# Patient Record
Sex: Male | Born: 1995 | Race: White | Hispanic: No | Marital: Single | Smoking: Never smoker
Health system: Southern US, Community
[De-identification: ages and names within clinical notes are randomized; demographics above are authoritative.]

## PROBLEM LIST (undated history)

## (undated) DIAGNOSIS — F909 Attention-deficit hyperactivity disorder, unspecified type: Secondary | ICD-10-CM

---
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
## 2021-12-27 ENCOUNTER — Emergency Department
Admission: EM | Admit: 2021-12-27 | Discharge: 2021-12-27 | Disposition: A | Discharge status: Home / Self Care | Payer: Self-pay | Attending: Emergency Medicine | Admitting: Emergency Medicine

## 2021-12-27 ENCOUNTER — Emergency Department: Payer: Self-pay

## 2021-12-27 ENCOUNTER — Other Ambulatory Visit: Payer: Self-pay

## 2021-12-27 DIAGNOSIS — L03114 Cellulitis of left upper limb: Secondary | ICD-10-CM | POA: Insufficient documentation

## 2021-12-27 DIAGNOSIS — Z23 Encounter for immunization: Secondary | ICD-10-CM | POA: Insufficient documentation

## 2021-12-27 LAB — CBC WITH DIFFERENTIAL/PLATELET
Abs Immature Granulocytes: 0.04 10*3/uL (ref 0.00–0.07)
Basophils Absolute: 0 10*3/uL (ref 0.0–0.1)
Basophils Relative: 0 %
Eosinophils Absolute: 0 10*3/uL (ref 0.0–0.5)
Eosinophils Relative: 0 %
HCT: 43.9 % (ref 39.0–52.0)
Hemoglobin: 14.8 g/dL (ref 13.0–17.0)
Immature Granulocytes: 0 %
Lymphocytes Relative: 24 %
Lymphs Abs: 2.9 10*3/uL (ref 0.7–4.0)
MCH: 30.8 pg (ref 26.0–34.0)
MCHC: 33.7 g/dL (ref 30.0–36.0)
MCV: 91.3 fL (ref 80.0–100.0)
Monocytes Absolute: 1.5 10*3/uL — ABNORMAL HIGH (ref 0.1–1.0)
Monocytes Relative: 12 %
Neutro Abs: 7.9 10*3/uL — ABNORMAL HIGH (ref 1.7–7.7)
Neutrophils Relative %: 64 %
Platelets: 300 10*3/uL (ref 150–400)
RBC: 4.81 MIL/uL (ref 4.22–5.81)
RDW: 12.6 % (ref 11.5–15.5)
WBC: 12.3 10*3/uL — ABNORMAL HIGH (ref 4.0–10.5)
nRBC: 0 % (ref 0.0–0.2)

## 2021-12-27 LAB — COMPREHENSIVE METABOLIC PANEL
ALT: 16 U/L (ref 0–44)
AST: 21 U/L (ref 15–41)
Albumin: 4 g/dL (ref 3.5–5.0)
Alkaline Phosphatase: 58 U/L (ref 38–126)
Anion gap: 11 (ref 5–15)
BUN: 13 mg/dL (ref 6–20)
CO2: 23 mmol/L (ref 22–32)
Calcium: 9.3 mg/dL (ref 8.9–10.3)
Chloride: 104 mmol/L (ref 98–111)
Creatinine, Ser: 0.82 mg/dL (ref 0.61–1.24)
GFR, Estimated: >60 mL/min (ref >60)
Glucose, Bld: 146 mg/dL — ABNORMAL HIGH (ref 70–99)
Potassium: 3.5 mmol/L (ref 3.5–5.1)
Sodium: 138 mmol/L (ref 135–145)
Total Bilirubin: 1 mg/dL (ref 0.3–1.2)
Total Protein: 7.4 g/dL (ref 6.5–8.1)

## 2021-12-27 LAB — LACTIC ACID, PLASMA: Lactic Acid, Venous: 0.8 mmol/L (ref 0.5–1.9)

## 2021-12-27 MED ORDER — CEPHALEXIN 500 MG PO CAPS: 500.0000 mg | ORAL_CAPSULE | Freq: Three times a day (TID) | ORAL | 0 refills | Status: DC

## 2021-12-27 MED ORDER — SULFAMETHOXAZOLE-TRIMETHOPRIM 800-160 MG PO TABS: 1.0000 | ORAL_TABLET | Freq: Two times a day (BID) | ORAL | 0 refills | Status: DC

## 2021-12-27 MED ORDER — SULFAMETHOXAZOLE-TRIMETHOPRIM 800-160 MG PO TABS
1.0000 | ORAL_TABLET | Freq: Once | ORAL | Status: AC
  Administered 2021-12-27: 1 via ORAL
  Filled 2021-12-27: qty 1

## 2021-12-27 MED ORDER — CEPHALEXIN 500 MG PO CAPS
500.0000 mg | ORAL_CAPSULE | Freq: Once | ORAL | Status: AC
  Administered 2021-12-27: 500 mg via ORAL
  Filled 2021-12-27: qty 1

## 2021-12-27 MED ORDER — TETANUS-DIPHTH-ACELL PERTUSSIS 5-2.5-18.5 LF-MCG/0.5 IM SUSY
0.5000 mL | PREFILLED_SYRINGE | Freq: Once | INTRAMUSCULAR | Status: AC
  Administered 2021-12-27: 0.5 mL via INTRAMUSCULAR
  Filled 2021-12-27: qty 0.5

## 2021-12-27 NOTE — ED Triage Notes (Signed)
Pt with left hand swelling. Pt states he had a splinter last week, but hand swelling has begun today. Pt with redness and swelling noted to left hand. Pt states has decreased movement in hand due to swelling. Pt denies known fever or chills.  ?

## 2021-12-27 NOTE — ED Notes (Signed)
ONE SET OF BLOOD CULTURES OBTAINED IN TRIAGE ?

## 2021-12-27 NOTE — ED Provider Notes (Signed)
? ?[facility] ?Provider Note ? ? ? Event Date/Time  ? First MD Initiated Contact with Patient 12/27/21 2028   ?  (approximate) ? ? ?History  ? ?hand swelling ? ? ?HPI ? ?Logan Morris is a 26 y.o. male with no significant past medical history who complains of pain swelling and redness of the left palm which started this morning.  Constant, tender to the touch.  No loss of sensation or strength.  Full range of motion in the fingers. ? ?He reports that while he was doing woodworking a week ago, he sustained a splinter to the left palm.  He feels that he was able to retrieve it and everything seemed fine until today when he noted a lot of swelling and redness.  Denies any a sending pain, no chest pain or shortness of breath.  No fever. ?  ? ? ?Physical Exam  ? ?Triage Vital Signs: ?ED Triage Vitals [12/27/21 1906]  ?Enc Vitals Group  ?   BP (!) 169/77  ?   Pulse Rate 84  ?   Resp 20  ?   Temp 100 ?F (37.8 ?C)  ?   Temp Source Oral  ?   SpO2 100 %  ?   Weight 150 lb (68 kg)  ?   Height 6' (1.829 m)  ?   Head Circumference   ?   Peak Flow   ?   Pain Score 5  ?   Pain Loc   ?   Pain Edu?   ?   Excl. in GC?   ? ? ?Most recent vital signs: ?Vitals:  ? 12/27/21 1906  ?BP: (!) 169/77  ?Pulse: 84  ?Resp: 20  ?Temp: 100 ?F (37.8 ?C)  ?SpO2: 100%  ? ? ? ?General: Awake, no distress.  ?CV:  Good peripheral perfusion.  Normal pulses ?Resp:  Normal effort.  ?Abd:  No distention.  ?Other:  Left hand shows a small puncture wound over the palm overlying the midshaft of the second metacarpal.  There is swelling in that area extending over the thenar eminence to the dorsum.  There is no crepitus.  Firm palpation I am able to express a small amount of purulent drainage through the wound.  No fluctuance.  No devitalized or dusky skin ?As part of the exam, I performed point-of-care bedside ultrasound to evaluate the soft tissues.  Imaging demonstrates cobblestoning of the superficial soft tissues consistent with  edema.  no foreign body identified. ? ? ?ED Results / Procedures / Treatments  ? ?Labs ?(all labs ordered are listed, but only abnormal results are displayed) ?Labs Reviewed  ?COMPREHENSIVE METABOLIC PANEL - Abnormal; Notable for the following components:  ?    Result Value  ? Glucose, Bld 146 (*)   ? All other components within normal limits  ?CBC WITH DIFFERENTIAL/PLATELET - Abnormal; Notable for the following components:  ? WBC 12.3 (*)   ? Neutro Abs 7.9 (*)   ? Monocytes Absolute 1.5 (*)   ? All other components within normal limits  ?LACTIC ACID, PLASMA  ? ? ? ?EKG ? ? ? ? ?RADIOLOGY ? ? ?PROCEDURES: ? ?Critical Care performed: No ? ?Procedures ? ? ?MEDICATIONS ORDERED IN ED: ?Medications  ?cephALEXin (KEFLEX) capsule 500 mg (has no administration in time range)  ?sulfamethoxazole-trimethoprim (BACTRIM DS) 800-160 MG per tablet 1 tablet (has no administration in time range)  ?Tdap (BOOSTRIX) injection 0.5 mL (has no administration in time range)  ? ? ? ?IMPRESSION /  MDM / ASSESSMENT AND PLAN / ED COURSE  ?I reviewed the triage vital signs and the nursing notes. ?             ?               ? ?Patient presents with cellulitis left hand.  He is right-hand dominant.  No evidence of osteomyelitis necrotizing fasciitis or abscess.  No sausage digit, no tenosynovitis.  No identifiable retained foreign body.  Tetanus updated.  Will start on Keflex and Bactrim for cellulitis.  Doubt fracture.  Return precautions discussed ? ? ? ? ?  ? ? ?FINAL CLINICAL IMPRESSION(S) / ED DIAGNOSES  ? ?Final diagnoses:  ?Cellulitis of left hand  ? ? ? ?Rx / DC Orders  ? ?ED Discharge Orders   ? ?      Ordered  ?  sulfamethoxazole-trimethoprim (BACTRIM DS) 800-160 MG tablet  2 times daily       ? 12/27/21 2130  ?  cephALEXin (KEFLEX) 500 MG capsule  3 times daily       ? 12/27/21 2130  ? ?  ?  ? ?  ? ? ? ?Note:  This document was prepared using Dragon voice recognition software and may include unintentional dictation errors. ?   ?Sharman Cheek, MD ?12/27/21 2135 ? ?

## 2021-12-27 NOTE — ED Notes (Signed)
Pt states he had a splinter stuck in left hand now hand is red and swollen ? ?

## 2021-12-27 NOTE — ED Notes (Signed)
Pt gave verbal consent to DC. Unable to sign due to hand  ? ?

## 2021-12-30 ENCOUNTER — Emergency Department
Admission: EM | Admit: 2021-12-30 | Discharge: 2021-12-30 | Disposition: A | Discharge status: Other Acute Inpatient Hospital | Payer: Self-pay | Attending: Emergency Medicine | Admitting: Emergency Medicine

## 2021-12-30 ENCOUNTER — Inpatient Hospital Stay (HOSPITAL_COMMUNITY)
Admission: EM | Admit: 2021-12-30 | Discharge: 2022-01-01 | DRG: 603 | Disposition: A | Discharge status: Home / Self Care | Payer: Self-pay | Attending: Internal Medicine | Admitting: Internal Medicine

## 2021-12-30 ENCOUNTER — Encounter: Payer: Self-pay | Admitting: Intensive Care

## 2021-12-30 ENCOUNTER — Encounter (HOSPITAL_COMMUNITY): Payer: Self-pay | Admitting: Emergency Medicine

## 2021-12-30 ENCOUNTER — Other Ambulatory Visit: Payer: Self-pay

## 2021-12-30 DIAGNOSIS — F17220 Nicotine dependence, chewing tobacco, uncomplicated: Secondary | ICD-10-CM | POA: Diagnosis present

## 2021-12-30 DIAGNOSIS — L03114 Cellulitis of left upper limb: Secondary | ICD-10-CM | POA: Diagnosis present

## 2021-12-30 DIAGNOSIS — I891 Lymphangitis: Secondary | ICD-10-CM

## 2021-12-30 DIAGNOSIS — F909 Attention-deficit hyperactivity disorder, unspecified type: Secondary | ICD-10-CM | POA: Diagnosis present

## 2021-12-30 DIAGNOSIS — Z20822 Contact with and (suspected) exposure to covid-19: Secondary | ICD-10-CM | POA: Diagnosis present

## 2021-12-30 DIAGNOSIS — L03129 Acute lymphangitis of unspecified part of limb: Secondary | ICD-10-CM | POA: Diagnosis present

## 2021-12-30 DIAGNOSIS — W458XXA Other foreign body or object entering through skin, initial encounter: Secondary | ICD-10-CM | POA: Diagnosis present

## 2021-12-30 DIAGNOSIS — L0391 Acute lymphangitis, unspecified: Secondary | ICD-10-CM | POA: Insufficient documentation

## 2021-12-30 DIAGNOSIS — L03124 Acute lymphangitis of left upper limb: Principal | ICD-10-CM | POA: Diagnosis present

## 2021-12-30 DIAGNOSIS — S61432A Puncture wound without foreign body of left hand, initial encounter: Secondary | ICD-10-CM | POA: Diagnosis present

## 2021-12-30 LAB — CBC WITH DIFFERENTIAL/PLATELET
Abs Immature Granulocytes: 0.03 10*3/uL (ref 0.00–0.07)
Basophils Absolute: 0 10*3/uL (ref 0.0–0.1)
Basophils Relative: 0 %
Eosinophils Absolute: 0 10*3/uL (ref 0.0–0.5)
Eosinophils Relative: 0 %
HCT: 43.1 % (ref 39.0–52.0)
Hemoglobin: 14.7 g/dL (ref 13.0–17.0)
Immature Granulocytes: 0 %
Lymphocytes Relative: 23 %
Lymphs Abs: 2.2 10*3/uL (ref 0.7–4.0)
MCH: 30.7 pg (ref 26.0–34.0)
MCHC: 34.1 g/dL (ref 30.0–36.0)
MCV: 90 fL (ref 80.0–100.0)
Monocytes Absolute: 1.2 10*3/uL — ABNORMAL HIGH (ref 0.1–1.0)
Monocytes Relative: 12 %
Neutro Abs: 6 10*3/uL (ref 1.7–7.7)
Neutrophils Relative %: 65 %
Platelets: 302 10*3/uL (ref 150–400)
RBC: 4.79 MIL/uL (ref 4.22–5.81)
RDW: 12.1 % (ref 11.5–15.5)
WBC: 9.5 10*3/uL (ref 4.0–10.5)
nRBC: 0 % (ref 0.0–0.2)

## 2021-12-30 LAB — COMPREHENSIVE METABOLIC PANEL
ALT: 17 U/L (ref 0–44)
AST: 21 U/L (ref 15–41)
Albumin: 3.9 g/dL (ref 3.5–5.0)
Alkaline Phosphatase: 47 U/L (ref 38–126)
Anion gap: 11 (ref 5–15)
BUN: 12 mg/dL (ref 6–20)
CO2: 25 mmol/L (ref 22–32)
Calcium: 9.4 mg/dL (ref 8.9–10.3)
Chloride: 101 mmol/L (ref 98–111)
Creatinine, Ser: 0.9 mg/dL (ref 0.61–1.24)
GFR, Estimated: >60 mL/min (ref >60)
Glucose, Bld: 133 mg/dL — ABNORMAL HIGH (ref 70–99)
Potassium: 3.8 mmol/L (ref 3.5–5.1)
Sodium: 137 mmol/L (ref 135–145)
Total Bilirubin: 0.6 mg/dL (ref 0.3–1.2)
Total Protein: 7.7 g/dL (ref 6.5–8.1)

## 2021-12-30 LAB — LACTIC ACID, PLASMA: Lactic Acid, Venous: 1.4 mmol/L (ref 0.5–1.9)

## 2021-12-30 LAB — RESP PANEL BY RT-PCR (FLU A&B, COVID) ARPGX2
Influenza A by PCR: NEGATIVE
Influenza B by PCR: NEGATIVE
SARS Coronavirus 2 by RT PCR: NEGATIVE

## 2021-12-30 MED ORDER — VANCOMYCIN HCL 1500 MG/300ML IV SOLN
1500.0000 mg | Freq: Once | INTRAVENOUS | Status: AC
  Administered 2021-12-30: 1500 mg via INTRAVENOUS
  Filled 2021-12-30: qty 300

## 2021-12-30 MED ORDER — FLUCONAZOLE 200 MG PO TABS
400.0000 mg | ORAL_TABLET | Freq: Once | ORAL | Status: DC
Start: 2021-12-30 — End: 2021-12-30
  Filled 2021-12-30: qty 2

## 2021-12-30 MED ORDER — FLUCONAZOLE IN SODIUM CHLORIDE 400-0.9 MG/200ML-% IV SOLN
400.0000 mg | INTRAVENOUS | Status: DC
  Administered 2021-12-31 – 2022-01-01 (×2): 400 mg via INTRAVENOUS
  Filled 2021-12-30 (×3): qty 200

## 2021-12-30 NOTE — ED Notes (Signed)
Pt to ED for L hand swelling that appears to be cellulitis. L hand swollen and red. Pt was seen on Sunday at same ED for same, and placed on PO abx. ?Pt now also has streaking along whole L arm up to axilla.  ? ?Pt had had splinter in L hand about 1 week prior. ? ?Pt had ultrasound on Sunday and was told splinter was out completely. Pt had dug out splinter with knife at home. Pt was given Tdap and sent home on PO abx and has taken as prescribed. ? ?

## 2021-12-30 NOTE — ED Notes (Signed)
Called [facility] back (Lauren) , she has paged ED attending whom is still tied up. Will page again in 15 mins ?

## 2021-12-30 NOTE — ED Notes (Signed)
Emtala reviewed by this RN ?

## 2021-12-30 NOTE — Assessment & Plan Note (Signed)
Continue IV rocephin, vanco, diflucan. Po vicodan for pain. ?

## 2021-12-30 NOTE — ED Provider Notes (Signed)
? ? ?[facility] ?Emergency Department Provider Note ? ? ? ? Event Date/Time  ? First MD Initiated Contact with Patient 12/30/21 1553   ?  (approximate) ? ? ?History  ? ?Cellulitis ? ? ?HPI ? ?Logan Morris is a 26 y.o. male with a history of ADHD, returns to the ED for evaluation of worsening left hand swelling as well as a red line streaking up the patient's arm.  Patient was seen here 3 days ago after he had a wood splinter in the palm.  Patient apparently removed the splinter prior to arrival, was further evaluated for some local skin infection.  Soft tissue bedside ultrasound did not reveal any gross foreign body, and the patient was treated empirically with oral antibiotics for cellulitis.  Patient returns to the ED today for worsening hand swelling.  Denies any purulent drainage, fever, chills, or sweats. ?  ? ? ?Physical Exam  ? ?Triage Vital Signs: ?ED Triage Vitals  ?Enc Vitals Group  ?   BP 12/30/21 1354 135/77  ?   Pulse Rate 12/30/21 1354 75  ?   Resp 12/30/21 1354 16  ?   Temp 12/30/21 1354 98.5 ?F (36.9 ?C)  ?   Temp Source 12/30/21 1354 Oral  ?   SpO2 12/30/21 1354 97 %  ?   Weight 12/30/21 1350 150 lb (68 kg)  ?   Height 12/30/21 1350 6' (1.829 m)  ?   Head Circumference --   ?   Peak Flow --   ?   Pain Score 12/30/21 1350 7  ?   Pain Loc --   ?   Pain Edu? --   ?   Excl. in GC? --   ? ? ?Most recent vital signs: ?Vitals:  ? 12/30/21 1800 12/30/21 1900  ?BP: (!) 134/91 136/68  ?Pulse: 65 65  ?Resp: 16 16  ?Temp:    ?SpO2: 100% 98%  ? ? ?General Awake, no distress.  ?CV:  Good peripheral perfusion. RRR ?RESP:  Normal effort. CTA ?ABD:  No distention.  ?SKIN:  Left upper extremity with significant edema to the right hand dorsally.  Patient with a scabbed left palmar wound without appreciable fluctuance or purulence.  Patient with significant lymphangitis up the volar aspect of the forearm to the axilla.  Normal composite fist on the left. ? ? ?ED Results / Procedures /  Treatments  ? ?Labs ?(all labs ordered are listed, but only abnormal results are displayed) ?Labs Reviewed  ?CBC WITH DIFFERENTIAL/PLATELET - Abnormal; Notable for the following components:  ?    Result Value  ? Monocytes Absolute 1.2 (*)   ? All other components within normal limits  ?COMPREHENSIVE METABOLIC PANEL - Abnormal; Notable for the following components:  ? Glucose, Bld 133 (*)   ? All other components within normal limits  ?RESP PANEL BY RT-PCR (FLU A&B, COVID) [id number]  ?CULTURE, BLOOD (ROUTINE X 2)  ?CULTURE, BLOOD (ROUTINE X 2)  ?LACTIC ACID, PLASMA  ? ? ? ?EKG ? ? ?RADIOLOGY ? ? ?No results found. ? ? ?PROCEDURES: ? ?Critical Care performed: No ? ?Procedures ? ? ?MEDICATIONS ORDERED IN ED: ?Medications  ?vancomycin (VANCOREADY) IVPB 1500 mg/300 mL (0 mg Intravenous Stopped 12/30/21 1912)  ? ? ? ?IMPRESSION / MDM / ASSESSMENT AND PLAN / ED COURSE  ?I reviewed the triage vital signs and the nursing notes. ?             ?               ? ?  Differential diagnosis includes, but is not limited to, cellulitis, tenosynovitis, palmar abscess, sepsis ? ?The patient is on the cardiac monitor to evaluate for evidence of arrhythmia and/or significant heart rate changes. ? ?----------------------------------------- ?4:49 PM on 12/30/2021 ?----------------------------------------- ?Sherryll Burger suggested telephone consult with Dr. Joice Lofts before agreeing to admit, as often hand infections are transferred. ? ?----------------------------------------- ?6:24 PM on 12/30/2021 ?----------------------------------------- ?S/W Dr. Joice Lofts regarding case, while patient receive IV vancomycin, he suggested transfer to hospital with hand specialist.  ? ?----------------------------------------- ?6:47 PM on 12/30/2021 ?----------------------------------------- ?S/W Dr. Frazier Butt (Ortho-Hand) at [facility] he accepts the patient ED-ED for further IV antibiotics.  ? ?Patient and family agreeable to transfer/care plan at this time.  ? ? ?Patient with  subsequent ED visit for ongoing left hand swelling and progressive lymphangitis, following failed outpatient PO antibiotic management. patient's diagnosis is consistent with cellulitis/lymphangitis. Patient will be transferred to [facility], ED for further evaluation and continue IV antibiotics by the Ortho hand specialist.   ? ? ?FINAL CLINICAL IMPRESSION(S) / ED DIAGNOSES  ? ?Final diagnoses:  ?Cellulitis of left upper extremity  ?Lymphangitis  ? ? ? ?Rx / DC Orders  ? ?ED Discharge Orders   ? ? None  ? ?  ? ? ? ?Note:  This document was prepared using Dragon voice recognition software and may include unintentional dictation errors. ? ?  ?Lissa Hoard, PA-C ?12/30/21 1929 ? ?  ?Phineas Semen, MD ?12/30/21 1945 ? ?

## 2021-12-30 NOTE — ED Triage Notes (Signed)
Patient presents with worsening left hand swelling and now has red line present from hand to axillary. Seen here 12/27/21 and placed on oral antibiotics.  ?

## 2021-12-30 NOTE — ED Triage Notes (Signed)
Pt BIB [facility] from [facility] with c/o worsening left hand swelling with red streaking up his arm.  ?

## 2021-12-30 NOTE — Assessment & Plan Note (Addendum)
Admit to med/surg bed. Observation. IV rocephin, vanco. Add IV diflucan. 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 Send fungal blood cultures. ?

## 2021-12-30 NOTE — ED Notes (Signed)
Pt resting comfortably in bed with visitor x1 at bedside, NAD noted, resps even regular and nonlabored. Pt with swelling noted to L hand/wrist, with redness extending up to shoulder. Pt denies needs at this time. ?

## 2021-12-30 NOTE — ED Notes (Signed)
1st lactic drawn at 1600. Second lactic due at 1800. ?

## 2021-12-30 NOTE — ED Notes (Signed)
Provider at bedside

## 2021-12-30 NOTE — ED Notes (Signed)
Called [facility] Leotis Shames) concerning transfer of pt to [facility] ED per Frazier Butt, MD accepting for hand. Frazier Butt, MD wants Tomasa Blase, [place] to transfer pt to ED. Tomasa Blase, PA needs to speak with ED attending whom atbthis time is getting a STEMI in the ED but will return call in approx. 30 min. ?

## 2021-12-30 NOTE — ED Notes (Signed)
Called [facility] to request consult with transfer to Hand specialty spoke to [facility] @ [phone] waiting on call back ? ?

## 2021-12-30 NOTE — ED Notes (Signed)
1 set cultures and lactic drawn with IV placement and sent to lab. ?

## 2021-12-30 NOTE — Subjective & Objective (Addendum)
CC: left hand swelling, red streaking up left arm ?HPI: ?26 year old male with no previous medical history presents to the ER today with red streaking and swelling of his left hand.  Patient states about 2 weeks ago he sustained 2 puncture sites to his left hand with splinters while he was working.  He states that he pulled out 2 splinters out of his hand 1 that night in the shower and next 1 in the next morning.  He states he had a Janeway bit of finger soreness of his left index finger for several days afterwards.  He went about a week without any pain or swelling in his hand.  Then suddenly developed pain and swelling in his left hand.  He went to the ER.  This was on December 27, 2021.  He was prescribed Bactrim and Keflex.  He states he took 3 days of this.  Today he noticed increasing swelling of his left hand, red streaking up his left arm.  He went back to [facility] ER.  He was transferred to [facility], ER after ED to ED transfer.  EDP had contacted hand surgery. ? ?Patient had tetanus shot in the ER this week. ? ?Hand surgery is reviewed the pictures and does not think the patient has compartment syndrome. ? ?[facility] hospitalist contacted for admission. ?

## 2021-12-30 NOTE — H&P (Signed)
?History and Physical  ? ? ?Logan Morris TKZ:[id number] DOB: 1996-04-08 DOA: 12/30/2021 ? ?DOS: the patient was seen and examined on 12/30/2021 ? ?PCP: Pcp, No  ? ?Patient coming from: Home ? ?I have personally briefly reviewed patient's old medical [facility] ? ?CC: left hand swelling, red streaking up left arm ?HPI: ?26 year old male with no previous medical history presents to the ER today with red streaking and swelling of his left hand.  Patient states about 2 weeks ago he sustained 2 puncture sites to his left hand with splinters while he was working.  He states that he pulled out 2 splinters out of his hand 1 that night in the shower and next 1 in the next morning.  He states he had a Coke bit of finger soreness of his left index finger for several days afterwards.  He went about a week without any pain or swelling in his hand.  Then suddenly developed pain and swelling in his left hand.  He went to the ER.  This was on December 27, 2021.  He was prescribed Bactrim and Keflex.  He states he took 3 days of this.  Today he noticed increasing swelling of his left hand, red streaking up his left arm.  [facility].  [facility] to ED transfer.  EDP had contacted hand surgery. ? ?Patient had tetanus shot in the ER this week. ? ?Hand surgery is reviewed the pictures and does not think the patient has compartment syndrome. ? ?[facility] hospitalist contacted for admission.  ? ?ED Course: [facility] after phone consultation with ortho hand. ? ?Review of Systems:  ?Review of Systems  ?Constitutional: Negative.  Negative for chills and fever.  ?HENT: Negative.    ?Eyes: Negative.   ?Respiratory: Negative.    ?Cardiovascular: Negative.   ?Gastrointestinal: Negative.   ?Genitourinary: Negative.   ?Musculoskeletal:   ?     Pain in left hand ?Red streaking up his left arm ?Pt is right hand dominant. ?+pain along red streaking  ?Skin:   ?      Increased erythema left dorsum of hand. Red streaking up left arm.  ?Neurological: Negative.   ?Endo/Heme/Allergies: Negative.   ?Psychiatric/Behavioral: Negative.    ?All other systems reviewed and are negative. ? ?Past Medical History:  ?Diagnosis Date  ? Attention deficit hyperactivity disorder (ADHD)   ? ? ?History reviewed. No pertinent surgical history. ? ? reports that he has never smoked. He uses smokeless tobacco. He reports current alcohol use of about 12.0 standard drinks per week. He reports current drug use. Drug: Marijuana. ? ?No Known Allergies ? ?No family history on file. ? ?Prior to Admission medications   ?Medication Sig Start Date End Date Taking? Authorizing Provider  ?cephALEXin (KEFLEX) 500 MG capsule Take 1 capsule (500 mg total) by mouth 3 (three) times daily. 12/27/21  Yes Sharman CheekStafford, Phillip, MD  ?sulfamethoxazole-trimethoprim (BACTRIM DS) 800-160 MG tablet Take 1 tablet by mouth 2 (two) times daily. 12/27/21  Yes Sharman CheekStafford, Phillip, MD  ? ? ?Physical Exam: ?Vitals:  ? 12/30/21 2141  ?BP: 140/80  ?Pulse: 68  ?Resp: 18  ?Temp: 98.6 ?F (37 ?C)  ?SpO2: 98%  ? ? ?Physical Exam ?Vitals and nursing note reviewed.  ?Constitutional:   ?   General: He is not in acute distress. ?   Appearance: Normal appearance. He is normal weight. He is not ill-appearing, toxic-appearing  or diaphoretic.  ?HENT:  ?   Head: Normocephalic and atraumatic.  ?   Nose: Nose normal. No rhinorrhea.  ?Eyes:  ?   General: No scleral icterus. ?Cardiovascular:  ?   Rate and Rhythm: Normal rate and regular rhythm.  ?   Pulses: Normal pulses.  ?Pulmonary:  ?   Effort: Pulmonary effort is normal. No respiratory distress.  ?   Breath sounds: Normal breath sounds. No wheezing or rales.  ?Abdominal:  ?   General: Abdomen is flat. Bowel sounds are normal. There is no distension.  ?   Tenderness: There is no abdominal tenderness. There is no guarding or rebound.  ?Skin: ?   Capillary Refill: Capillary refill takes less than 2 seconds.  ?    Findings: Erythema present.  ?   Comments: +edema dorsum left hand ?Multiple calluses hand ?Small puncture mark on palm of left hand ?+red streaking up left forearm and left upper arm. Outlined in black marker ? ?See pictures of erythema and red streaking.  ?Neurological:  ?   General: No focal deficit present.  ?   Mental Status: He is alert and oriented to person, place, and time.  ?  ? ? ? ? ? ? ? ? ?Labs on Admission: I have personally reviewed following labs and imaging studies ? ?CBC: ?Recent Labs  ?Lab 12/27/21 ?1916 12/30/21 ?1359  ?WBC 12.3* 9.5  ?NEUTROABS 7.9* 6.0  ?HGB 14.8 14.7  ?HCT 43.9 43.1  ?MCV 91.3 90.0  ?PLT 300 302  ? ?Basic Metabolic Panel: ?Recent Labs  ?Lab 12/27/21 ?1916 12/30/21 ?1359  ?NA 138 137  ?K 3.5 3.8  ?CL 104 101  ?CO2 23 25  ?GLUCOSE 146* 133*  ?BUN 13 12  ?CREATININE 0.82 0.90  ?CALCIUM 9.3 9.4  ? ?GFR: ?Estimated Creatinine Clearance: 120.7 mL/min (by C-G formula based on SCr of 0.9 mg/dL). ?Liver Function Tests: ?Recent Labs  ?Lab 12/27/21 ?1916 12/30/21 ?1359  ?AST 21 21  ?ALT 16 17  ?ALKPHOS 58 47  ?BILITOT 1.0 0.6  ?PROT 7.4 7.7  ?ALBUMIN 4.0 3.9  ? ?No results for input(s): LIPASE, AMYLASE in the last 168 hours. ?No results for input(s): AMMONIA in the last 168 hours. ?Coagulation Profile: ?No results for input(s): INR, PROTIME in the last 168 hours. ?Cardiac Enzymes: ?No results for input(s): CKTOTAL, CKMB, CKMBINDEX, TROPONINI, TROPONINIHS in the last 168 hours. ?BNP (last 3 results) ?No results for input(s): PROBNP in the last 8760 hours. ?HbA1C: ?No results for input(s): HGBA1C in the last 72 hours. ?CBG: ?No results for input(s): GLUCAP in the last 168 hours. ?Lipid Profile: ?No results for input(s): CHOL, HDL, LDLCALC, TRIG, CHOLHDL, LDLDIRECT in the last 72 hours. ?Thyroid Function Tests: ?No results for input(s): TSH, T4TOTAL, FREET4, T3FREE, THYROIDAB in the last 72 hours. ?Anemia Panel: ?No results for input(s): VITAMINB12, FOLATE, FERRITIN, TIBC, IRON, RETICCTPCT  in the last 72 hours. ?Urine analysis: ?   ?Component Value Date/Time  ? COLORURINE Yellow 06/04/2014 0402  ? APPEARANCEUR Clear 06/04/2014 0402  ? LABSPEC 1.020 06/04/2014 0402  ? PHURINE 6.0 06/04/2014 0402  ? GLUCOSEU Negative 06/04/2014 0402  ? HGBUR Negative 06/04/2014 0402  ? BILIRUBINUR Negative 06/04/2014 0402  ? KETONESUR Trace 06/04/2014 0402  ? PROTEINUR Negative 06/04/2014 0402  ? NITRITE Negative 06/04/2014 0402  ? LEUKOCYTESUR Negative 06/04/2014 0402  ? ? ?Radiological Exams on Admission: I have personally reviewed images ?No results found. ? ?EKG: I have personally reviewed EKG: no EKG ? ?Assessment/Plan ?Principal Problem: ?  Acute lymphangitis of upper arm ?Active Problems: ?  Cellulitis of left hand ?  ? ?Assessment and Plan: ?* Acute lymphangitis of upper arm ?Admit to med/surg bed. Observation. IV rocephin, vanco. Add IV diflucan. 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 Send fungal blood cultures. ? ?Cellulitis of left hand ?Continue IV rocephin, vanco, diflucan. Po vicodan for pain. ? ? ?DVT prophylaxis: SCDs ?Code Status: Full Code ?Family Communication: discussed with pt and his aunt   ?Disposition Plan: return home  ?Consults called: none  ?Admission status: Observation, Med-Surg ? ? ?Carollee Herter, DO ?[facility] ?12/30/2021, 11:47 PM  ? ? ?

## 2021-12-31 ENCOUNTER — Encounter (HOSPITAL_COMMUNITY): Payer: Self-pay | Admitting: Internal Medicine

## 2021-12-31 LAB — CBC WITH DIFFERENTIAL/PLATELET
Abs Immature Granulocytes: 0.02 10*3/uL (ref 0.00–0.07)
Basophils Absolute: 0.1 10*3/uL (ref 0.0–0.1)
Basophils Relative: 1 %
Eosinophils Absolute: 0 10*3/uL (ref 0.0–0.5)
Eosinophils Relative: 0 %
HCT: 41.4 % (ref 39.0–52.0)
Hemoglobin: 13.9 g/dL (ref 13.0–17.0)
Immature Granulocytes: 0 %
Lymphocytes Relative: 25 %
Lymphs Abs: 2.1 10*3/uL (ref 0.7–4.0)
MCH: 31.1 pg (ref 26.0–34.0)
MCHC: 33.6 g/dL (ref 30.0–36.0)
MCV: 92.6 fL (ref 80.0–100.0)
Monocytes Absolute: 1 10*3/uL (ref 0.1–1.0)
Monocytes Relative: 12 %
Neutro Abs: 5.2 10*3/uL (ref 1.7–7.7)
Neutrophils Relative %: 62 %
Platelets: 278 10*3/uL (ref 150–400)
RBC: 4.47 MIL/uL (ref 4.22–5.81)
RDW: 12.3 % (ref 11.5–15.5)
WBC: 8.4 10*3/uL (ref 4.0–10.5)
nRBC: 0 % (ref 0.0–0.2)

## 2021-12-31 LAB — COMPREHENSIVE METABOLIC PANEL
ALT: 19 U/L (ref 0–44)
AST: 23 U/L (ref 15–41)
Albumin: 3.7 g/dL (ref 3.5–5.0)
Alkaline Phosphatase: 49 U/L (ref 38–126)
Anion gap: 9 (ref 5–15)
BUN: 12 mg/dL (ref 6–20)
CO2: 27 mmol/L (ref 22–32)
Calcium: 9.3 mg/dL (ref 8.9–10.3)
Chloride: 101 mmol/L (ref 98–111)
Creatinine, Ser: 0.98 mg/dL (ref 0.61–1.24)
GFR, Estimated: >60 mL/min (ref >60)
Glucose, Bld: 128 mg/dL — ABNORMAL HIGH (ref 70–99)
Potassium: 4.1 mmol/L (ref 3.5–5.1)
Sodium: 137 mmol/L (ref 135–145)
Total Bilirubin: 0.5 mg/dL (ref 0.3–1.2)
Total Protein: 6.6 g/dL (ref 6.5–8.1)

## 2021-12-31 LAB — HIV ANTIBODY (ROUTINE TESTING W REFLEX): HIV Screen 4th Generation wRfx: NONREACTIVE

## 2021-12-31 MED ORDER — ACETAMINOPHEN 650 MG RE SUPP: 650.0000 mg | Freq: Four times a day (QID) | RECTAL | Status: DC | PRN

## 2021-12-31 MED ORDER — ONDANSETRON HCL 4 MG PO TABS: 4.0000 mg | ORAL_TABLET | Freq: Four times a day (QID) | ORAL | Status: DC | PRN

## 2021-12-31 MED ORDER — ENOXAPARIN SODIUM 40 MG/0.4ML IJ SOSY
40.0000 mg | PREFILLED_SYRINGE | INTRAMUSCULAR | Status: DC
  Administered 2021-12-31: 40 mg via SUBCUTANEOUS
  Filled 2021-12-31: qty 0.4

## 2021-12-31 MED ORDER — ACETAMINOPHEN 325 MG PO TABS: 650.0000 mg | ORAL_TABLET | Freq: Four times a day (QID) | ORAL | Status: DC | PRN

## 2021-12-31 MED ORDER — SODIUM CHLORIDE 0.9 % IV SOLN
1.0000 g | INTRAVENOUS | Status: DC
  Administered 2021-12-31: 1 g via INTRAVENOUS
  Filled 2021-12-31 (×2): qty 10

## 2021-12-31 MED ORDER — ONDANSETRON HCL 4 MG/2ML IJ SOLN: 4.0000 mg | Freq: Four times a day (QID) | INTRAMUSCULAR | Status: DC | PRN

## 2021-12-31 MED ORDER — SODIUM CHLORIDE 0.9 % IV SOLN
1.0000 g | Freq: Once | INTRAVENOUS | Status: AC
  Administered 2021-12-31: 1 g via INTRAVENOUS
  Filled 2021-12-31: qty 10

## 2021-12-31 MED ORDER — VANCOMYCIN HCL 1250 MG/250ML IV SOLN
1250.0000 mg | Freq: Two times a day (BID) | INTRAVENOUS | Status: DC
  Administered 2021-12-31 – 2022-01-01 (×3): 1250 mg via INTRAVENOUS
  Filled 2021-12-31 (×5): qty 250

## 2021-12-31 MED ORDER — HYDROCODONE-ACETAMINOPHEN 5-325 MG PO TABS: 1.0000 | ORAL_TABLET | ORAL | Status: DC | PRN

## 2021-12-31 NOTE — ED Provider Notes (Signed)
?[facility] ?Provider Note ? ? ?CSN: [id number] ?Arrival date & time: 12/30/21  2137 ? ?  ? ?History ? ?Chief Complaint  ?Patient presents with  ? Cellulitis  ? ? ?Logan Morris is a 26 y.o. male. ? ?HPI ? ?Patient without significant medical history presents with complaints of left hand infection, patient is right-hand dominant.  Patient states about 2 weeks ago he was being the bed frame and a splinter went to the palm of his left hand.  He states he has removed without difficulty.  But on Sunday he noted worsening swelling and pain in his hand so he went to the emergency room where they started him on Bactrim as well as Keflex.  He went back to the emergency department today where they noted that his hand was getting more swollen and he is having streaking up his left arm, he was then transferred to [facility] for further evaluation.  He states that he currently has some pain in his left hand and remains mainly within his palm, he is able to move all fingers but he states he has difficulty moving his index finger due to the swelling.  He has had no fevers or chills no paresthesias or weakness he not immunocompromise, up-to-date on his tetanus shot, denies any illicit drug use. ? ? ?I have reviewed patient's chart he was [facility] Sunday, where he obtains x-ray of the hand which was unremarkable, ultrasound was also performed which was unremarkable as well, he was discharged home with Bactrim as well as Keflex.  He was then seen [facility] at [facility] where they noted that he had an elevated white count and worsening swelling concerning for cellulitis versus tenosynovitis, Dr. Frazier ButtBenfield of hand surgery was consulted on the patient he recommended ED ED transfer, blood cultures have been obtained, and he can receive a single dose of vancomycin. ?Home Medications ?Prior to Admission medications   ?Medication Sig Start Date End Date Taking? Authorizing Provider  ?cephALEXin (KEFLEX)  500 MG capsule Take 1 capsule (500 mg total) by mouth 3 (three) times daily. 12/27/21  Yes Sharman CheekStafford, Phillip, MD  ?sulfamethoxazole-trimethoprim (BACTRIM DS) 800-160 MG tablet Take 1 tablet by mouth 2 (two) times daily. 12/27/21  Yes Sharman CheekStafford, Phillip, MD  ?   ? ?Allergies    ?Patient has no known allergies.   ? ?Review of Systems   ?Review of Systems  ?Constitutional:  Negative for chills and fever.  ?Respiratory:  Negative for shortness of breath.   ?Cardiovascular:  Negative for chest pain.  ?Gastrointestinal:  Negative for abdominal pain.  ?Skin:  Positive for rash and wound.  ?Neurological:  Negative for headaches.  ? ?Physical Exam ?Updated Vital Signs ?BP 140/80   Pulse 68   Temp 98.6 ?F (37 ?C)   Resp 18   SpO2 98%  ?Physical Exam ?Vitals and nursing note reviewed.  ?Constitutional:   ?   General: He is not in acute distress. ?   Appearance: He is not ill-appearing.  ?HENT:  ?   Head: Normocephalic and atraumatic.  ?   Nose: No congestion.  ?Eyes:  ?   Conjunctiva/sclera: Conjunctivae normal.  ?Cardiovascular:  ?   Rate and Rhythm: Normal rate and regular rhythm.  ?   Pulses: Normal pulses.  ?   Heart sounds: No murmur heard. ?  No friction rub. No gallop.  ?Pulmonary:  ?   Effort: No respiratory distress.  ?   Breath sounds: No wheezing,  rhonchi or rales.  ?Musculoskeletal:  ?   Comments: Patient is left-handed to visualize it was erythematous and swollen mainly on the dorsum and palm of the hand, he had noted streaking on the anterior  of his forearm going up into his upper arm, hand was warm to the touch, he has a small puncture wound on the palm beneath the second MCP joint.  He has decreased range of motion with flexion of the second digit, but full motion with flexion-extension in all other joints of the hand, it was nontender during my examination.  Neurovascularly intact.  Please see picture for full detail.  ?Skin: ?   General: Skin is warm and dry.  ?Neurological:  ?   Mental Status: He is alert.   ?Psychiatric:     ?   Mood and Affect: Mood normal.  ? ? ? ? ? ? ? ? ? ?ED Results / Procedures / Treatments   ?Labs ?(all labs ordered are listed, but only abnormal results are displayed) ?Labs Reviewed  ?FUNGUS CULTURE, BLOOD  ?RAPID URINE DRUG SCREEN, HOSP PERFORMED  ? ? ?EKG ?None ? ?Radiology ?No results found. ? ?Procedures ?Procedures  ? ? ?Medications Ordered in ED ?Medications  ?fluconazole (DIFLUCAN) IVPB 400 mg (has no administration in time range)  ? ? ?ED Course/ Medical Decision Making/ A&P ?  ?                        ?Medical Decision Making ?Amount and/or Complexity of Data Reviewed ?Labs: ordered. ? ?Risk ?Decision regarding hospitalization. ? ? ?This patient presents to the ED for concern of right hand swelling, this involves an extensive number of treatment options, and is a complaint that carries with it a high risk of complications and morbidity.  The differential diagnosis includes sepsis, cellulitis, tenosynovitis, ? ? ? ?Additional history obtained: ? ?Additional history obtained from the chart medical record ?External records from outside source obtained and reviewed including please see HPI for further detail ? ? ?Co morbidities that complicate the patient evaluation ? ?N/A ? ?Social Determinants of Health: ? ?N/A ? ? ? ?Lab Tests: ? ?I Ordered, and personally interpreted labs.  The pertinent results include: CBC unremarkable, CMP shows glucose of 133, lactic unremarkable, respiratory panel negative, blood cultures pending ? ? ?Imaging Studies ordered: ? ?I ordered imaging studies including N/A ?I independently visualized and interpreted imaging which showed N/A ?I agree with the radiologist interpretation ? ? ?Cardiac Monitoring: ? ?The patient was maintained on a cardiac monitor.  I personally viewed and interpreted the cardiac monitored which showed an underlying rhythm of: N/A ? ? ?Medicines ordered and prescription drug management: ? ?I ordered medication including Diflucan for fungal  infection ?I have reviewed the patients home medicines and have made adjustments as needed ? ?Critical Interventions: ? ?N/A ? ? ?Reevaluation: ? ?Presents with hand infection I suspect this is likely cellulitis l with possible lymphangitis will consult with hand surgery for further recommendations. ? ?Consultations Obtained: ? ?I requested consultation with the hand surgery Dr. Frazier Butt,  and discussed lab and imaging findings as well as pertinent plan - they recommend: He was ischemia treated with IV antibiotics, does not feel this is tenosynovitis without significant tenderness along the palm.  He will be available consultation if needed. ?Spoke with Dr. Imogene Burn of the hospitalist team he will admit but asked we add on fungal cultures and started on antifungal medication. ? ?Rule out ?Low suspicion for sepsis patient  not meet sepsis or SIRS criteria.  Low suspicion for tenosynovitis as he has no significant tenderness along his flexor tendon, he does have limited range of motion in his second digit but I suspect this is secondary due to edema.  Low Suspicion for abscess as there is no fluctuance or induration present.  ? ? ? ?Dispostion and problem list ? ?After consideration of the diagnostic results and the patients response to treatment, I feel that the patent would benefit from admission. ? ?Cellulitis-likely patient has cellulitis from a puncture wound, started on broad-spectrum antibiotics as well as MRIs for fungal infection patient need further monitoring. ? ? ? ? ? ? ? ? ? ? ? ?Final Clinical Impression(s) / ED Diagnoses ?Final diagnoses:  ?Cellulitis of left upper extremity  ? ? ?Rx / DC Orders ?ED Discharge Orders   ? ? None  ? ?  ? ? ?  ?Carroll Sage, PA-C ?12/31/21 7353 ? ?  ?Dione Booze, MD ?12/31/21 0730 ? ?

## 2021-12-31 NOTE — Progress Notes (Signed)
Pharmacy Antibiotic Note ? ?Logan Morris is a 26 y.o. male admitted on 12/30/2021 with cellulitis.  Pharmacy has been consulted for vancomycin dosing. ? ?CBC 9.5 on 3/15 down from 12.3 on 3/12. ?Temp (24hrs), Avg:98.6 ?F (37 ?C), Min:98.5 ?F (36.9 ?C), Max:98.6 ?F (37 ?C) ? ?Plan: ?Vancomycin IV 1500 mg IV x 1 loading dose. ?Vancomycin 1250 mg IV Q 12 hrs. Goal AUC 400-550. ?Expected AUC: 490 ?SCr used: 0.9 ? ? ?Recent Labs  ?Lab 12/27/21 ?1916 12/30/21 ?1359 12/30/21 ?1552  ?WBC 12.3* 9.5  --   ?CREATININE 0.82 0.90  --   ?LATICACIDVEN 0.8  --  1.4  ?  ?Estimated Creatinine Clearance: 120.7 mL/min (by C-G formula based on SCr of 0.9 mg/dL).   ? ?No Known Allergies ? ?Antimicrobials this admission: ?Vancomycin 3/16 >> ?Ceftriaxone 3/16 >> ? ?Thank you for allowing pharmacy to be a part of this patient?s care. ? ?Alver Fisher, PharmD Candidate [phone] ?12/31/2021 1:00 AM ? ?

## 2021-12-31 NOTE — TOC Progression Note (Addendum)
Transition of Care (TOC) - Progression Note  ? ? ?Patient Details  ?Name: Shigeo Baugh Rakestraw ?MRN: [id number] ?Date of Birth: September 26, 1996 ? ?Transition of Care (TOC) CM/SW Contact  ?Kingsley Plan, RN ?Phone Number: ?12/31/2021, 11:41 AM ? ?Clinical Narrative:    ?Spoke to patient at bedside. Patient does not have insurance or PCP. Patient lives in [place] , discussed [facility].  ?Changed pharmacy to [facility] pharmacy , provided [facility] information information on AVS, also emailed Murtis Sink  at [facility]  ? ?Murtis Sink returned email and will call patient for appointment / NCM confirmed patient's cell number with patinet  ? ? ?Transition of Care (TOC) Screening Note ? ? ?Patient Details  ?Name: Corwin Kuiken Weisbecker ?Date of Birth: 05-17-1996 ? ? ?Transition of Care Department [facility]) has reviewed patient and no TOC needs have been identified at this time. We will continue to monitor patient advancement through interdisciplinary progression rounds. If new patient transition needs arise, please place a TOC consult. ?  ? ?  ?  ? ?Expected Discharge Plan and Services ?  ?  ?  ?  ?  ?                ?  ?  ?  ?  ?  ?  ?  ?  ?  ?  ? ? ?Social Determinants of Health (SDOH) Interventions ?  ? ?Readmission Risk Interventions ?No flowsheet data found. ? ?

## 2021-12-31 NOTE — Progress Notes (Signed)
?PROGRESS NOTE ? ? ? ?Logan Morris  XBD:[id number] DOB: Dec 02, 1995 DOA: 12/30/2021 ?PCP: Pcp, No  ? ? ?Brief Narrative:  ?26 year old male with no previous medical history presents to the ER today with red streaking and swelling of his left hand.  Patient states about 2 weeks ago he sustained 2 puncture sites to his left hand with splinters while he was working.  He states that he pulled out 2 splinters out of his hand 1 that night in the shower and next 1 in the next morning.  He states he had a Sherman bit of finger soreness of his left index finger for several days afterwards.  He went about a week without any pain or swelling in his hand.  Then suddenly developed pain and swelling in his left hand.  He went to the ER.  This was on December 27, 2021.  He was prescribed Bactrim and Keflex.  He states he took 3 days of this.  Today he noticed increasing swelling of his left hand, red streaking up his left arm.  He went back to [facility] ER.  He was transferred to [facility] ER after ED to ED transfer.  EDP had contacted hand surgery. ?  ?Patient had tetanus shot in the ER this week. ?  ?Hand surgery is reviewed the pictures and does not think the patient has compartment syndrome. ?  ?[facility] hospitalist contacted for admission.  ? ? ? ?Consultants:  ? ? ?Procedures:  ? ?Antimicrobials:  ?Ceftriaxone and vancomycin ? ? ?Subjective: ?Rt hand still very swollen, unable to move fully , but erythema improving. ? ?Objective: ?Vitals:  ? 12/31/21 0715 12/31/21 0730 12/31/21 0903 12/31/21 0954  ?BP: (!) 113/100 113/61 132/66   ?Pulse: 70 (!) 55 62   ?Resp:  18 19   ?Temp:   98.2 ?F (36.8 ?C)   ?TempSrc:   Oral   ?SpO2: 97% 97% 100%   ?Weight:    67.1 kg  ?Height:    6' (1.829 m)  ? ?No intake or output data in the 24 hours ending 12/31/21 1449 ?Filed Weights  ? 12/31/21 0954  ?Weight: 67.1 kg  ? ? ?Examination: ?Calm, NAD ?Cta no w/r ?Reg s1/s2 no gallop ?Soft benign +bs ?No edema. Rt hand swelling, mild erythema more on hand area  now.  ?Aaoxox3  ?Mood and affect appropriate in current setting  ? ? ? ?Data Reviewed: I have personally reviewed following labs and imaging studies ? ?CBC: ?Recent Labs  ?Lab 12/27/21 ?1916 12/30/21 ?1359 12/31/21 ?0206  ?WBC 12.3* 9.5 8.4  ?NEUTROABS 7.9* 6.0 5.2  ?HGB 14.8 14.7 13.9  ?HCT 43.9 43.1 41.4  ?MCV 91.3 90.0 92.6  ?PLT 300 302 278  ? ?Basic Metabolic Panel: ?Recent Labs  ?Lab 12/27/21 ?1916 12/30/21 ?1359 12/31/21 ?0206  ?NA 138 137 137  ?K 3.5 3.8 4.1  ?CL 104 101 101  ?CO2 23 25 27   ?GLUCOSE 146* 133* 128*  ?BUN 13 12 12   ?CREATININE 0.82 0.90 0.98  ?CALCIUM 9.3 9.4 9.3  ? ?GFR: ?Estimated Creatinine Clearance: 109.4 mL/min (by C-G formula based on SCr of 0.98 mg/dL). ?Liver Function Tests: ?Recent Labs  ?Lab 12/27/21 ?1916 12/30/21 ?1359 12/31/21 ?0206  ?AST 21 21 23   ?ALT 16 17 19   ?ALKPHOS 58 47 49  ?BILITOT 1.0 0.6 0.5  ?PROT 7.4 7.7 6.6  ?ALBUMIN 4.0 3.9 3.7  ? ?No results for input(s): LIPASE, AMYLASE in the last 168 hours. ?No results for input(s): AMMONIA in the last  168 hours. ?Coagulation Profile: ?No results for input(s): INR, PROTIME in the last 168 hours. ?Cardiac Enzymes: ?No results for input(s): CKTOTAL, CKMB, CKMBINDEX, TROPONINI in the last 168 hours. ?BNP (last 3 results) ?No results for input(s): PROBNP in the last 8760 hours. ?HbA1C: ?No results for input(s): HGBA1C in the last 72 hours. ?CBG: ?No results for input(s): GLUCAP in the last 168 hours. ?Lipid Profile: ?No results for input(s): CHOL, HDL, LDLCALC, TRIG, CHOLHDL, LDLDIRECT in the last 72 hours. ?Thyroid Function Tests: ?No results for input(s): TSH, T4TOTAL, FREET4, T3FREE, THYROIDAB in the last 72 hours. ?Anemia Panel: ?No results for input(s): VITAMINB12, FOLATE, FERRITIN, TIBC, IRON, RETICCTPCT in the last 72 hours. ?Sepsis Labs: ?Recent Labs  ?Lab 12/27/21 ?1916 12/30/21 ?1552  ?LATICACIDVEN 0.8 1.4  ? ? ?Recent Results (from the past 240 hour(s))  ?Resp Panel by RT-PCR (Flu A&B, Covid) Nasopharyngeal Swab      Status: None  ? Collection Time: 12/30/21  3:52 PM  ? Specimen: Nasopharyngeal Swab; Nasopharyngeal(NP) swabs in vial transport medium  ?Result Value Ref Range Status  ? SARS Coronavirus 2 by RT PCR NEGATIVE NEGATIVE Final  ?  Comment: (NOTE) ?SARS-CoV-2 target nucleic acids are NOT DETECTED. ? ?The SARS-CoV-2 RNA is generally detectable in upper respiratory ?specimens during the acute phase of infection. The lowest ?concentration of SARS-CoV-2 viral copies this assay can detect is ?138 copies/mL. A negative result does not preclude SARS-Cov-2 ?infection and should not be used as the sole basis for treatment or ?other patient management decisions. A negative result may occur with  ?improper specimen collection/handling, submission of specimen other ?than nasopharyngeal swab, presence of viral mutation(s) within the ?areas targeted by this assay, and inadequate number of viral ?copies(<138 copies/mL). A negative result must be combined with ?clinical observations, patient history, and epidemiological ?information. The expected result is Negative. ? ?Fact Sheet for Patients:  ?BloggerCourse.comhttps://www.fda.gov/media/152166/download ? ?Fact Sheet for Healthcare Providers:  ?SeriousBroker.ithttps://www.fda.gov/media/152162/download ? ?This test is no t yet approved or cleared by the [place] FDA and  ?has been authorized for detection and/or diagnosis of SARS-CoV-2 by ?FDA under an Emergency Use Authorization (EUA). This EUA will remain  ?in effect (meaning this test can be used) for the duration of the ?COVID-19 declaration under Section 564(b)(1) of the Act, 21 ?U.S.C.section 360bbb-3(b)(1), unless the authorization is terminated  ?or revoked sooner.  ? ? ?  ? Influenza A by PCR NEGATIVE NEGATIVE Final  ? Influenza B by PCR NEGATIVE NEGATIVE Final  ?  Comment: (NOTE) ?The Xpert Xpress SARS-CoV-2/FLU/RSV plus assay is intended as an aid ?in the diagnosis of influenza from Nasopharyngeal swab specimens and ?should not be used as a sole basis  for treatment. Nasal washings and ?aspirates are unacceptable for Xpert Xpress SARS-CoV-2/FLU/RSV ?testing. ? ?Fact Sheet for Patients: ?BloggerCourse.comhttps://www.fda.gov/media/152166/download ? ?Fact Sheet for Healthcare Providers: ?SeriousBroker.ithttps://www.fda.gov/media/152162/download ? ?This test is not yet approved or cleared by the [place] FDA and ?has been authorized for detection and/or diagnosis of SARS-CoV-2 by ?FDA under an Emergency Use Authorization (EUA). This EUA will remain ?in effect (meaning this test can be used) for the duration of the ?COVID-19 declaration under Section 564(b)(1) of the Act, 21 U.S.C. ?section 360bbb-3(b)(1), unless the authorization is terminated or ?revoked. ? ?Performed at [facility], [address] [place], ?[place] [id number] ?  ?Culture, blood (routine x 2)     Status: None (Preliminary result)  ? Collection Time: 12/30/21  4:23 PM  ? Specimen: BLOOD  ?Result Value Ref Range Status  ?  Specimen Description BLOOD RFOA  Final  ? Special Requests BOTTLES DRAWN AEROBIC AND ANAEROBIC BCAV  Final  ? Culture   Final  ?  NO GROWTH < 12 HOURS ?Performed at [facility], [address] [place], [place] [address] ?  ? Report Status PENDING  Incomplete  ?Fungus culture, blood     Status: None (Preliminary result)  ? Collection Time: 12/31/21  1:48 AM  ? Specimen: BLOOD  ?Result Value Ref Range Status  ? Specimen Description BLOOD RIGHT ANTECUBITAL  Final  ? Special Requests   Final  ?  BOTTLES DRAWN AEROBIC AND ANAEROBIC Blood Culture adequate volume  ? Culture   Final  ?  NO GROWTH < 12 HOURS ?Performed at [facility], [address] [place], [place] [address] ?  ? Report Status PENDING  Incomplete  ?  ? ? ? ? ? ?Radiology Studies: ?No results found. ? ? ? ? ? ?Scheduled Meds: ?Continuous Infusions: ? cefTRIAXone (ROCEPHIN)  IV    ? fluconazole (DIFLUCAN) IV Stopped (12/31/21 0445)  ? vancomycin Stopped (12/31/21 5400)  ? ? ?Assessment & Plan: ?  ?Principal Problem: ?  Acute  lymphangitis of upper arm ?Active Problems: ?  Cellulitis of left hand ? ? ?Acute lymphangitis of upper arm ?Slow improvement ?Continue iv abx ? ?  ?Cellulitis of left hand ?Improving slowly ?Continue iv a

## 2021-12-31 NOTE — Progress Notes (Signed)
ANTICOAGULATION CONSULT NOTE - Initial Consult ? ?Pharmacy Consult for Enoxaparin ?Indication: VTE prophylaxis ? ?No Known Allergies ? ?Patient Measurements: ?Height: 6' (182.9 cm) ?Weight: 67.1 kg (147 lb 14.9 oz) ?IBW/kg (Calculated) : 77.6 ? ?Vital Signs: ?Temp: 98.2 ?F (36.8 ?C) (03/16 [id number]) ?Temp Source: Oral (03/16 [id number]) ?BP: 132/66 (03/16 0903) ?Pulse Rate: 62 (03/16 0903) ? ?Labs: ?Recent Labs  ?  12/30/21 ?1359 12/31/21 ?0206  ?HGB 14.7 13.9  ?HCT 43.1 41.4  ?PLT 302 278  ?CREATININE 0.90 0.98  ? ? ?Estimated Creatinine Clearance: 109.4 mL/min (by C-G formula based on SCr of 0.98 mg/dL). ? ? ?Medical History: ?Past Medical History:  ?Diagnosis Date  ? Attention deficit hyperactivity disorder (ADHD)   ? ? ?Assessment: ?26 years of age male who presented to ED with worsening red streaking and swelling of left hand from 2 sustained puncture sites from prior splinters. Patient was placed on IV antibiotics and hand surgery has been consulted. Pharmacy consulted to dose Lovenox for DVT prophylaxis.  ? ?SCr 0.98 - stable. CBC stable.  ? ?Goal of Therapy:  ?Monitor platelets by anticoagulation protocol: Yes ?  ?Plan:  ?Lovenox 40 mg SQ daily  ?Monitor SCr and CBC every 72 hours.  ?Monitor plans for potential surgery and need to hold therapy.  ? ?Sloan Leiter, PharmD, BCPS, BCCCP ?Clinical Pharmacist ?Please refer to [facility] for [place] numbers ?12/31/2021,3:34 PM ? ? ?

## 2022-01-01 MED ORDER — AMOXICILLIN-POT CLAVULANATE 875-125 MG PO TABS
1.0000 | ORAL_TABLET | Freq: Two times a day (BID) | ORAL | Status: DC
  Administered 2022-01-01: 1 via ORAL
  Filled 2022-01-01: qty 1

## 2022-01-01 MED ORDER — DOXYCYCLINE HYCLATE 100 MG PO TABS: 100.0000 mg | ORAL_TABLET | Freq: Two times a day (BID) | ORAL | 0 refills | Status: AC

## 2022-01-01 MED ORDER — AMOXICILLIN-POT CLAVULANATE 875-125 MG PO TABS: 1.0000 | ORAL_TABLET | Freq: Two times a day (BID) | ORAL | 0 refills | Status: AC

## 2022-01-01 MED ORDER — DOXYCYCLINE HYCLATE 100 MG PO TABS
100.0000 mg | ORAL_TABLET | Freq: Two times a day (BID) | ORAL | Status: DC
  Administered 2022-01-01: 100 mg via ORAL
  Filled 2022-01-01: qty 1

## 2022-01-01 NOTE — Progress Notes (Signed)
Earlier this shift as I entered pt room pt informed that splinter was removed from left hand. No heavy bleeding discoloration noted. Pt denied any pain ?

## 2022-01-01 NOTE — Discharge Summary (Addendum)
Logan Morris UMP:[id number] DOB: [birth year] DOA: 12/30/2021 ? ?PCP: Pcp, No ? ?Admit date: 12/30/2021 ?Discharge date: 01/01/2022 ? ?Admitted From: Home ?Disposition: Home ? ?Recommendations for Outpatient Follow-up:  ?Follow up with PCP in 1 week ?Please obtain BMP/CBC in one week ? ? ? ? ? ?Discharge Condition:Stable ?CODE STATUS: Full ?Diet recommendation: Regular  ? ? ?Brief/Interim Summary: ?Per HPI:26 year old male with no previous medical history presents to the ER today with red streaking and swelling of his left hand.  Patient states about 2 weeks ago he sustained 2 puncture sites to his left hand with splinters while he was working.  He states that he pulled out 2 splinters out of his hand 1 that night in the shower and next 1 in the next morning.  He states he had a Blinder bit of finger soreness of his left index finger for several days afterwards.  He went about a week without any pain or swelling in his hand.  Then suddenly developed pain and swelling in his left hand.  He went to the ER.  This was on December 27, 2021.  He was prescribed Bactrim and Keflex.  He states he took 3 days of this.  Today he noticed increasing swelling of his left hand, red streaking up his left arm.  He went back to [facility] ER.  He was transferred to [facility], ER after ED to ED transfer.  EDP had contacted hand surgery. ?  ?Patient had tetanus shot in the ER this week. ?  ?Hand surgery is reviewed the pictures and does not think the patient has compartment syndrome. ?Was admitted for IV antibiotics. ?He was started on IV antibiotics and kept his arms elevated.  His swelling has improved and redness has improved.  Will discharge home to complete antibiotics dose.  Spoke to pharmacy about which antibiotic will best fit his infection. ? ? ? ?Acute lymphangitis of upper arm ?Improved. ?Was started on IV antibiotics will transition to p.o. to complete course ?  ?  ?Cellulitis of left hand ?Swelling and erythema improved. ?Was started  on IV antibiotics will transition to p.o. ?Keep arms elevated as needed ?Keep area clean ? ? ?Discharge Diagnoses:  ?Principal Problem: ?  Acute lymphangitis of upper arm ?Active Problems: ?  Cellulitis of left hand ? ? ? ?Discharge Instructions ? ?Discharge Instructions   ? ? Call MD for:  severe uncontrolled pain   Complete by: As directed ?  ? Diet - low sodium heart healthy   Complete by: As directed ?  ? Discharge instructions   Complete by: As directed ?  ? Keep hands clean ?Keep elevated as needed  ? Increase activity slowly   Complete by: As directed ?  ? ?  ? ?Allergies as of 01/01/2022   ?No Known Allergies ?  ? ?  ?Medication List  ?  ? ?STOP taking these medications   ? ?cephALEXin 500 MG capsule ?Commonly known as: KEFLEX ?  ?sulfamethoxazole-trimethoprim 800-160 MG tablet ?Commonly known as: Bactrim DS ?  ? ?  ? ?TAKE these medications   ? ?amoxicillin-clavulanate 875-125 MG tablet ?Commonly known as: AUGMENTIN ?Take 1 tablet by mouth every 12 (twelve) hours for 6 days. ?  ?doxycycline 100 MG tablet ?Commonly known as: VIBRA-TABS ?Take 1 tablet (100 mg total) by mouth every 12 (twelve) hours for 6 days. ?  ? ?  ? ? Follow-up Information   ? ? [facility]. Schedule an appointment as soon as possible  for a visit.   ?Specialty: Primary Care ?Contact information: ?[address] ?[address] ?[place] [address] ?[phone] ? ?  ?  ? ?  ?  ? ?  ? ?No Known Allergies ? ?Consultations: ? ? ? ?Procedures/Studies: ?DG Hand Complete Left ? ?Result Date: 12/27/2021 ?CLINICAL DATA:  Swelling.  Splinter in hand last week. EXAM: LEFT HAND - COMPLETE 3+ VIEW COMPARISON:  None. FINDINGS: Diffuse soft tissue swelling. No radiopaque foreign bodies or soft tissue gas. No acute bony abnormality. Specifically, no fracture, subluxation, or dislocation. IMPRESSION: Diffuse soft tissue swelling.  No acute bony abnormality. Electronically Signed   By: Charlett Nose M.D.   On: 12/27/2021 19:47    ? ? ? ?Subjective: ?No chest pain or shortness of breath.  Feels arm and hand swelling is much better.   ? ?Discharge Exam: ?Vitals:  ? 01/01/22 0757 01/01/22 1220  ?BP: 127/65 131/74  ?Pulse: (!) 49 61  ?Resp: 18 18  ?Temp: 98.1 ?F (36.7 ?C) 98.3 ?F (36.8 ?C)  ?SpO2: 100% 97%  ? ?Vitals:  ? 01/01/22 0404 01/01/22 0530 01/01/22 0757 01/01/22 1220  ?BP: (!) 109/50 (!) 99/42 127/65 131/74  ?Pulse: (!) 53 60 (!) 49 61  ?Resp: 17 17 18 18   ?Temp: 97.8 ?F (36.6 ?C) 98.3 ?F (36.8 ?C) 98.1 ?F (36.7 ?C) 98.3 ?F (36.8 ?C)  ?TempSrc: Oral Oral Oral Oral  ?SpO2: 99% 99% 100% 97%  ?Weight:      ?Height:      ? ? ?General: Pt is alert, awake, not in acute distress ?Cardiovascular: RRR, S1/S2 +, no rubs, no gallops ?Respiratory: CTA bilaterally, no wheezing, no rhonchi ?Abdominal: Soft, NT, ND, bowel sounds + ?Extremities: no edema, no cyanosis left hand and arm with decreased swelling and erythema not present ? ? ? ?The results of significant diagnostics from this hospitalization (including imaging, microbiology, ancillary and laboratory) are listed below for reference.   ? ? ?Microbiology: ?Recent Results (from the past 240 hour(s))  ?Resp Panel by RT-PCR (Flu A&B, Covid) Nasopharyngeal Swab     Status: None  ? Collection Time: 12/30/21  3:52 PM  ? Specimen: Nasopharyngeal Swab; Nasopharyngeal(NP) swabs in vial transport medium  ?Result Value Ref Range Status  ? SARS Coronavirus 2 by RT PCR NEGATIVE NEGATIVE Final  ?  Comment: (NOTE) ?SARS-CoV-2 target nucleic acids are NOT DETECTED. ? ?The SARS-CoV-2 RNA is generally detectable in upper respiratory ?specimens during the acute phase of infection. The lowest ?concentration of SARS-CoV-2 viral copies this assay can detect is ?138 copies/mL. A negative result does not preclude SARS-Cov-2 ?infection and should not be used as the sole basis for treatment or ?other patient management decisions. A negative result may occur with  ?improper specimen collection/handling, submission of  specimen other ?than nasopharyngeal swab, presence of viral mutation(s) within the ?areas targeted by this assay, and inadequate number of viral ?copies(<138 copies/mL). A negative result must be combined with ?clinical observations, patient history, and epidemiological ?information. The expected result is Negative. ? ?Fact Sheet for Patients:  ?01/01/22 ? ?Fact Sheet for Healthcare Providers:  ?BloggerCourse.com ? ?This test is no t yet approved or cleared by the [place] FDA and  ?has been authorized for detection and/or diagnosis of SARS-CoV-2 by ?FDA under an Emergency Use Authorization (EUA). This EUA will remain  ?in effect (meaning this test can be used) for the duration of the ?COVID-19 declaration under Section 564(b)(1) of the Act, 21 ?U.S.C.section 360bbb-3(b)(1), unless the authorization is terminated  ?or  revoked sooner.  ? ? ?  ? Influenza A by PCR NEGATIVE NEGATIVE Final  ? Influenza B by PCR NEGATIVE NEGATIVE Final  ?  Comment: (NOTE) ?The Xpert Xpress SARS-CoV-2/FLU/RSV plus assay is intended as an aid ?in the diagnosis of influenza from Nasopharyngeal swab specimens and ?should not be used as a sole basis for treatment. Nasal washings and ?aspirates are unacceptable for Xpert Xpress SARS-CoV-2/FLU/RSV ?testing. ? ?Fact Sheet for Patients: ?BloggerCourse.comhttps://www.fda.gov/media/152166/download ? ?Fact Sheet for Healthcare Providers: ?SeriousBroker.ithttps://www.fda.gov/media/152162/download ? ?This test is not yet approved or cleared by the [place] FDA and ?has been authorized for detection and/or diagnosis of SARS-CoV-2 by ?FDA under an Emergency Use Authorization (EUA). This EUA will remain ?in effect (meaning this test can be used) for the duration of the ?COVID-19 declaration under Section 564(b)(1) of the Act, 21 U.S.C. ?section 360bbb-3(b)(1), unless the authorization is terminated or ?revoked. ? ?Performed at [facility], [address] [place], ?[place] [id number] ?  ?Culture, blood (routine x 2)     Status: None (Preliminary result)  ? Collection Time: 12/30/21  4:23 PM  ? Specimen: BLOOD  ?Result Value Ref Range Status  ? Specimen Description

## 2022-01-01 NOTE — Progress Notes (Signed)
Pt discharged to home. DC instructions given with male family member at bedside. No concerns voiced. Pt left unit ambulatory and stable accompanied by male family member. Refused to be wheeled downstairs. Encouraged to stop by [facility] pharmacy and pick up meds that were e-prescribed by Provider. Voiced understanding.  ?

## 2022-01-04 LAB — CULTURE, BLOOD (ROUTINE X 2): Culture: NO GROWTH

## 2022-01-07 LAB — FUNGUS CULTURE, BLOOD
Culture: NO GROWTH
Fungal Smear: NONE SEEN
Special Requests: ADEQUATE

## 2022-10-09 IMAGING — DX DG HAND COMPLETE 3+V*L*
3 series · 3 of 3 positions shown · non-contrast
Comparison: None.

CLINICAL DATA: Swelling.  Splinter in hand last week.

EXAM:
LEFT HAND - COMPLETE 3+ VIEW

[hand ap]
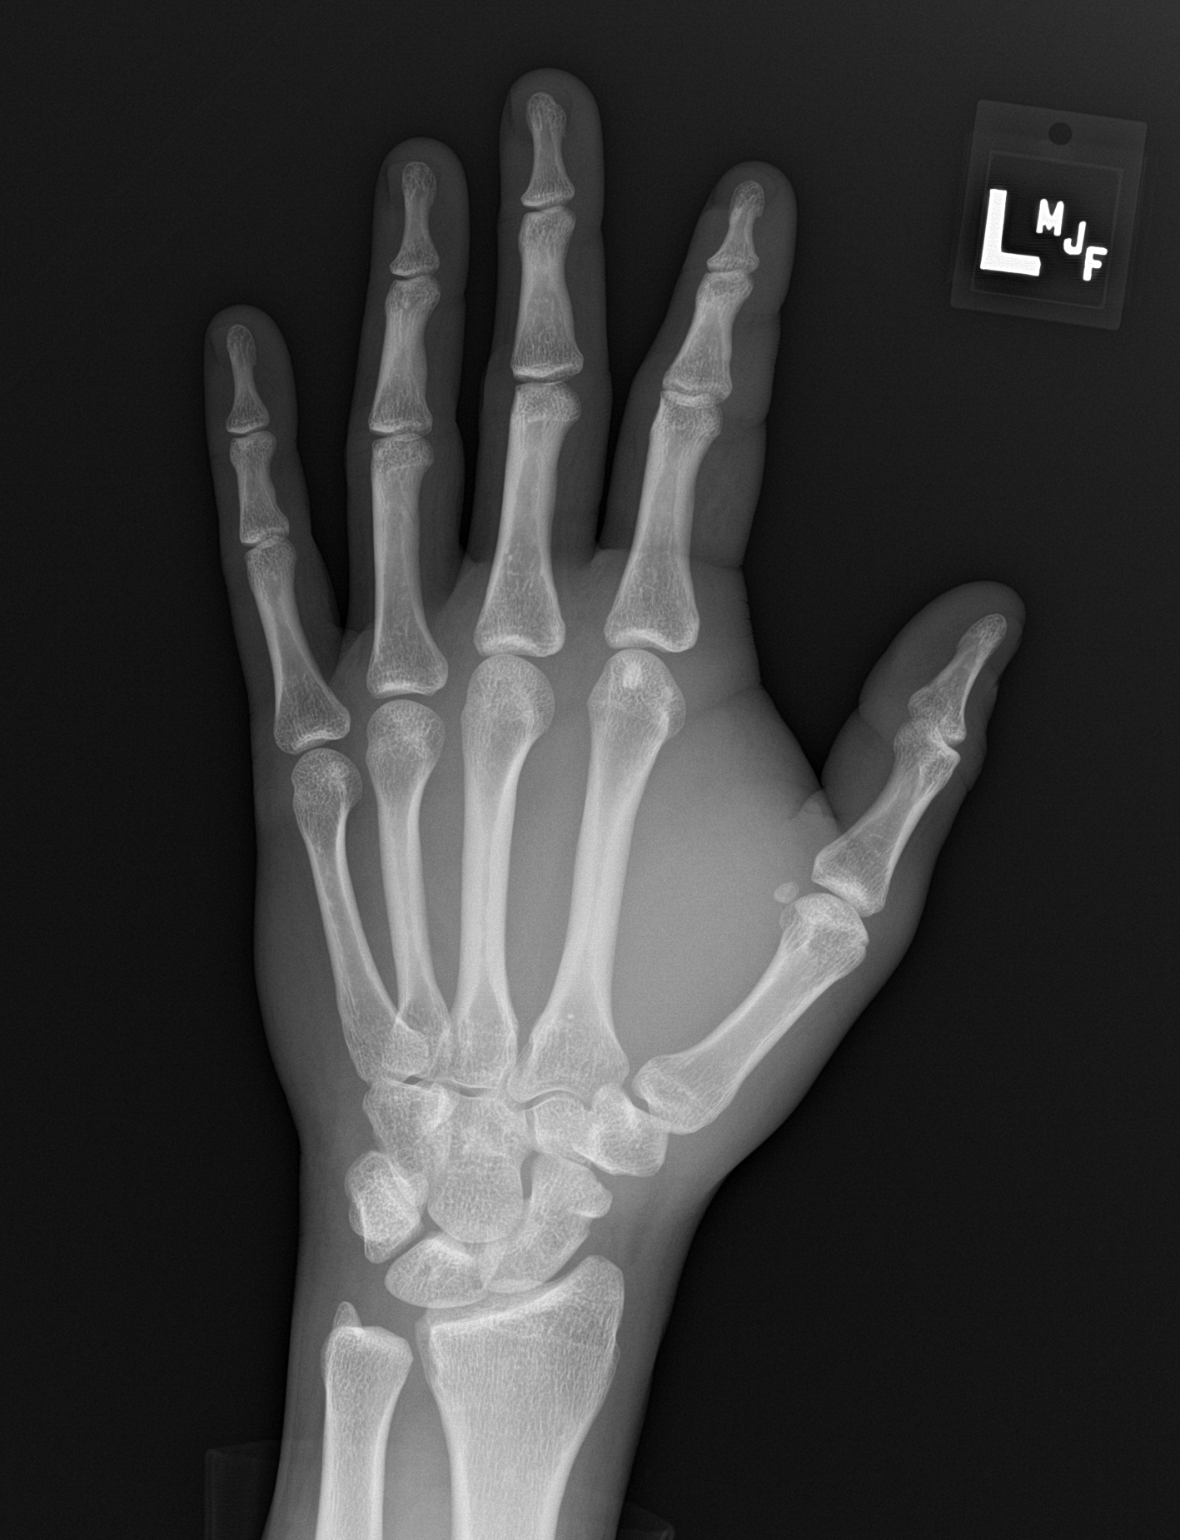

[hand obl]
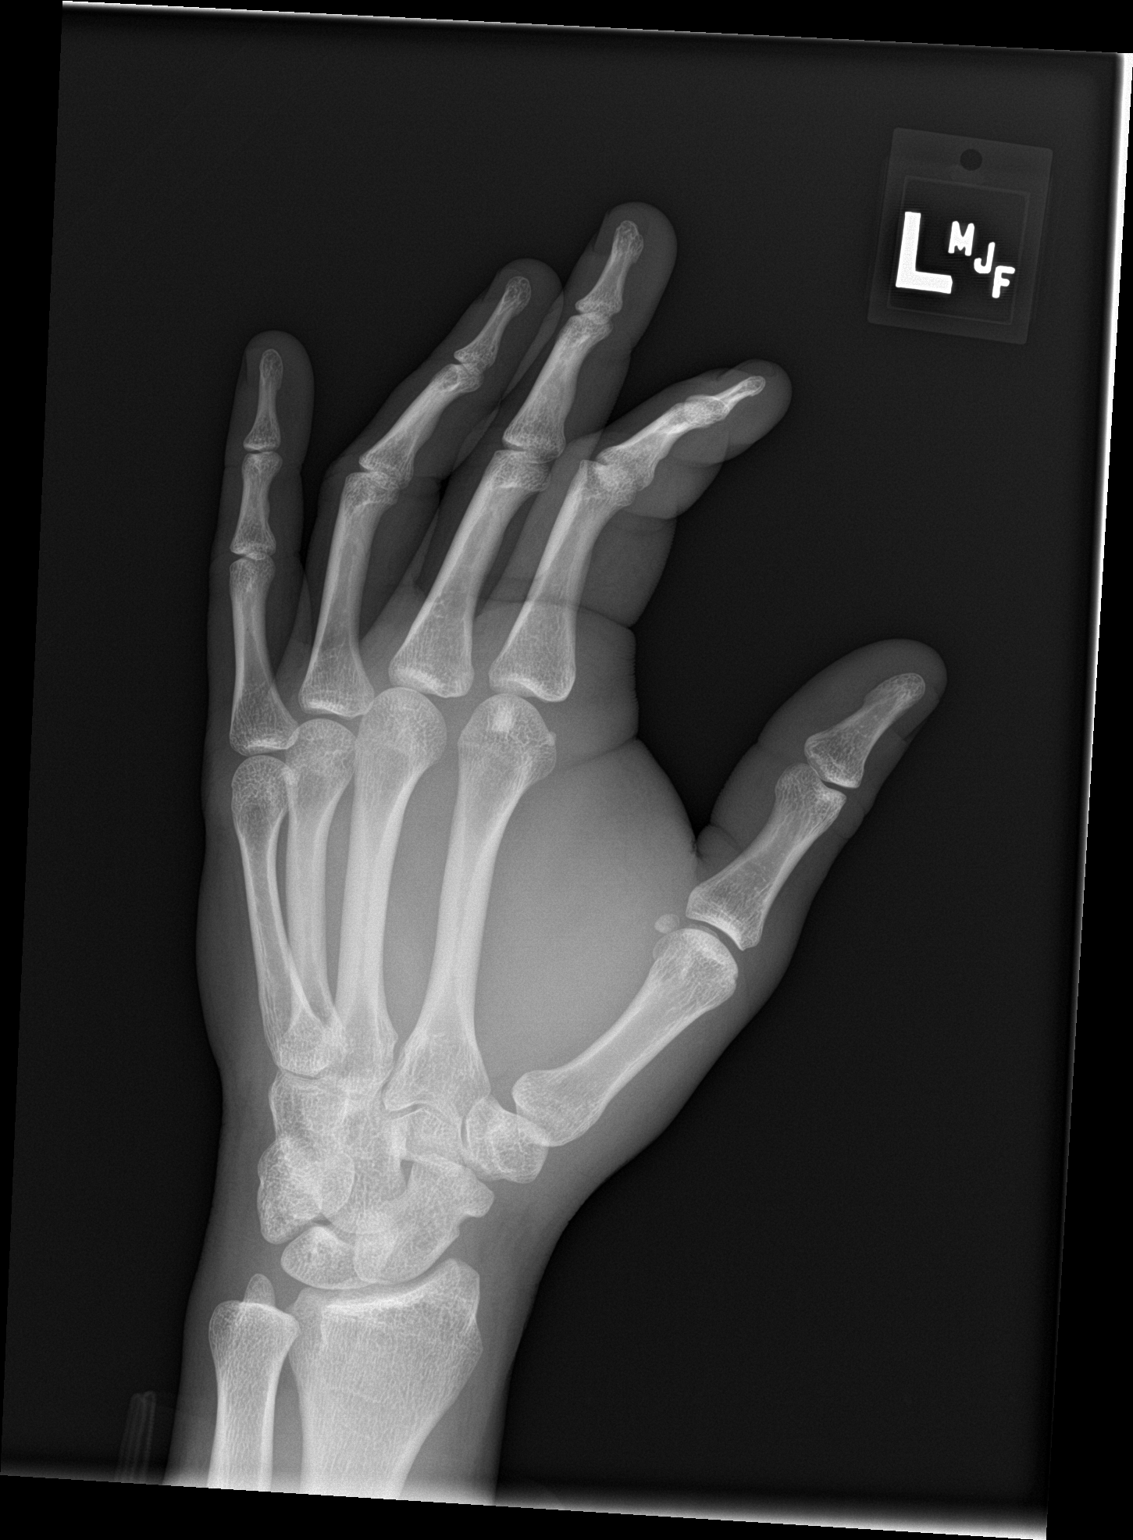

[hand lat]
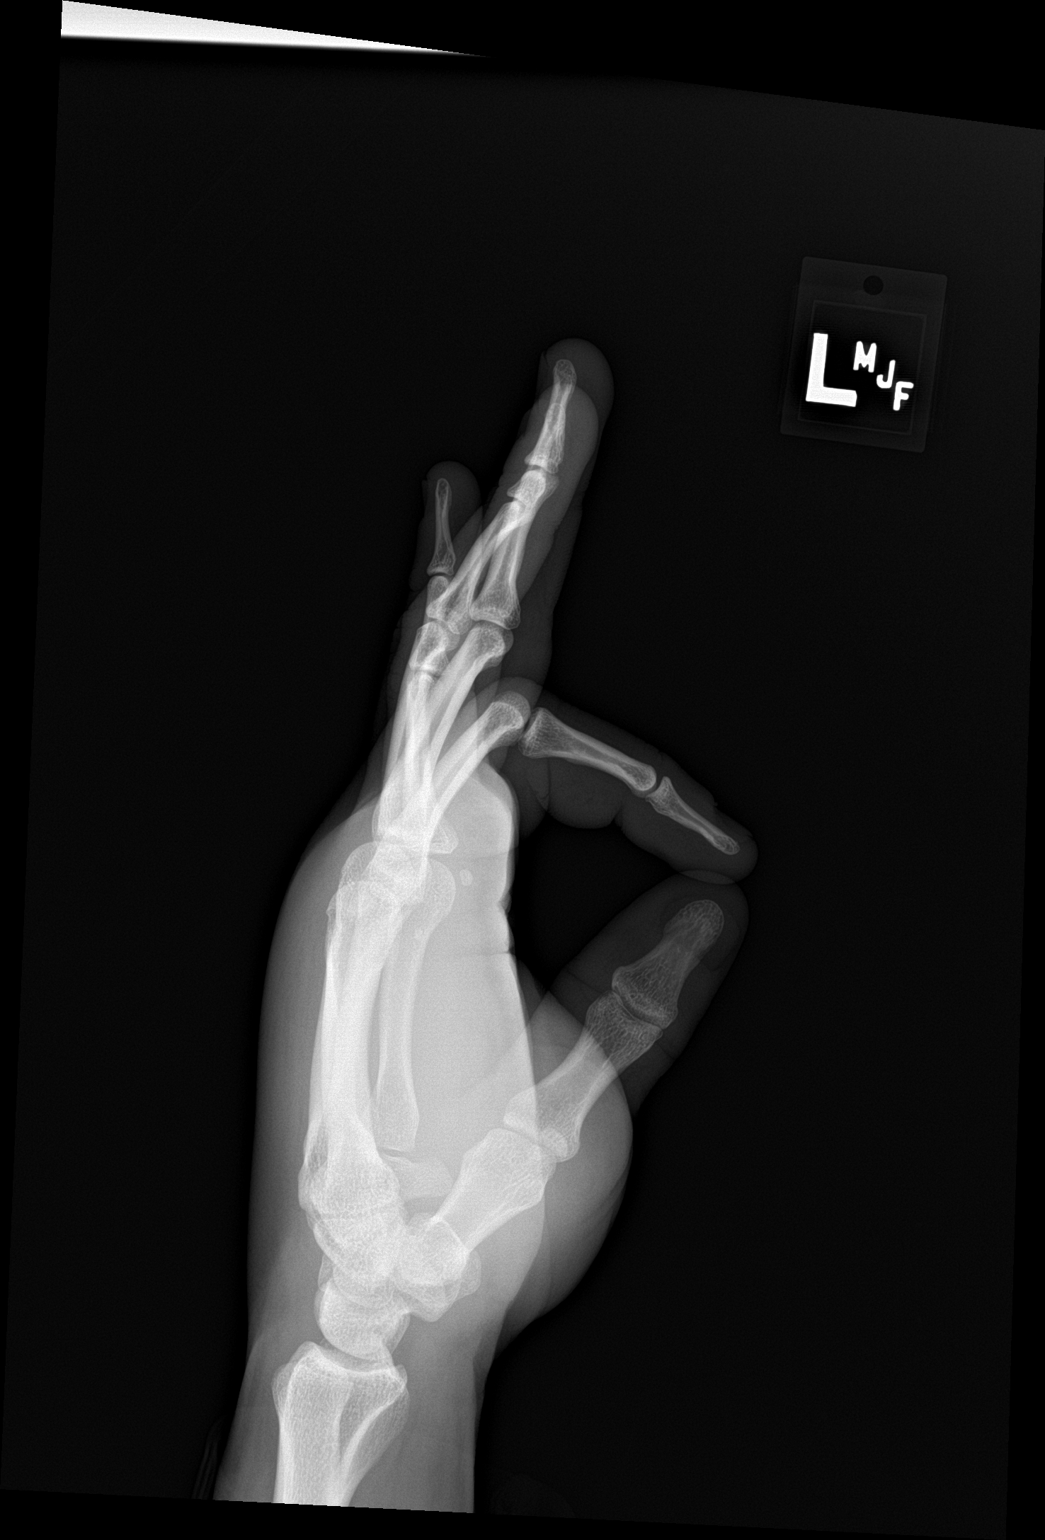

[3 of 3 positions shown; findings below may reference images not displayed]

FINDINGS: Diffuse soft tissue swelling. No radiopaque foreign bodies or soft
tissue gas. No acute bony abnormality. Specifically, no fracture,
subluxation, or dislocation.
IMPRESSION: Diffuse soft tissue swelling.  No acute bony abnormality.
